# Patient Record
Sex: Female | Born: 1970 | Hispanic: Yes | Marital: Married | Smoking: Never smoker
Health system: Southern US, Community
[De-identification: ages and names within clinical notes are randomized; demographics above are authoritative.]

---

## 2011-01-16 ENCOUNTER — Inpatient Hospital Stay: Payer: Self-pay | Admitting: Surgery

## 2011-01-20 LAB — PATHOLOGY REPORT

## 2014-05-14 NOTE — Discharge Summary (Signed)
PATIENT NAME:  Phyllis StandsSOLANA Coleman, Phyllis Coleman MR#:  [id number] DATE OF BIRTH:  [birth year]  DATE OF ADMISSION:  01/16/2011 DATE OF DISCHARGE:  01/18/2011  HOSPITAL COURSE: Phyllis Coleman is a 44 year old woman admitted through the Emergency Room with signs and symptoms consistent with possible acute appendicitis. She had been sick for several days prior to admission and admitted through the Emergency Room in the early morning of 01/16/2011. She was taken to the operating room that morning where she underwent a laparoscopic appendectomy. The procedure was uncomplicated. She did appear to have significant inflammatory change around her appendix. She improved the following day and was able to tolerate a regular diet. She was discharged home on the 29th to be followed in the office in 7 to 10 days' time. Bathing, activity, and driving instructions were given to the patient. She is to resume her home medications and take Vicodin for pain.   FINAL DISCHARGE DIAGNOSIS: Acute appendicitis.   SURGERY: Laparoscopic appendectomy.  ____________________________ Quentin Orealph L. Ely III, MD rle:drc D: 01/24/2011 20:31:37 ET T: 01/27/2011 10:54:12 ET JOB#: [id number]  cc: Quentin Orealph L. Ely III, MD, <Dictator> Quentin OreALPH L ELY MD ELECTRONICALLY SIGNED 01/27/2011 20:58

## 2016-01-21 HISTORY — PX: BREAST BIOPSY: SHX20

## 2016-10-22 ENCOUNTER — Ambulatory Visit
Admission: RE | Admit: 2016-10-22 | Discharge: 2016-10-22 | Disposition: A | Discharge status: Home / Self Care | Payer: Self-pay | Source: Ambulatory Visit | Attending: Oncology | Admitting: Oncology

## 2016-10-22 ENCOUNTER — Encounter: Payer: Self-pay | Admitting: *Deleted

## 2016-10-22 ENCOUNTER — Other Ambulatory Visit: Payer: Self-pay | Admitting: *Deleted

## 2016-10-22 ENCOUNTER — Ambulatory Visit: Payer: Self-pay | Attending: Oncology | Admitting: *Deleted

## 2016-10-22 VITALS — BP 113/75 | HR 75 | Temp 97.0°F | Ht 60.0 in | Wt 123.0 lb

## 2016-10-22 DIAGNOSIS — N63 Unspecified lump in unspecified breast: Secondary | ICD-10-CM

## 2016-10-22 NOTE — Patient Instructions (Signed)
Gave patient hand-out, Women Staying Healthy, Active and Well from [facility], with education on breast health, pap smears, heart and colon health. 

## 2016-10-22 NOTE — Progress Notes (Signed)
Subjective:     Patient ID: Phyllis Coleman, female   DOB: 10-21-1970, 46 y.o.   MRN: [id number]  HPI Complains of left breast mass and itching.   Review of Systems     Objective:   Physical Exam  Pulmonary/Chest: Right breast exhibits no inverted nipple, no mass, no nipple discharge, no skin change and no tenderness. Left breast exhibits mass. Left breast exhibits no inverted nipple, no nipple discharge, no skin change and no tenderness. Breasts are symmetrical.         Assessment:     46 year old Hispanic female referred to [facility] by Ethelda Chick, MD from [facility] for further evaluation of a left breast mass and itching.  [name], the interpreter present during the interview and exam.  Patient states she found the lump after noticing some itching about 1 month ago.  States she saw her doctor and was prescribed an antibiotic for her sinus infection that the doctor thought might help her mass.  States the itching went away, but the mass did not change.  On clinical breast exam I can palpate an approximate 4 mm nodule at 6:00 left breast at the edge of the areola.  No nipple discharge, skin changes or lymphadenopathy noted.  Taught self breast awareness.  Last pap on 09/14/14 was negative per office notes.  Since there is no pap result for review, patient was informed her next pap will probably be due in one year.  Patient has been screened for eligibility.  She does not have any insurance, Medicare or Medicaid.  She also meets financial eligibility.  Hand-out given on the Affordable Care Act.    Plan:     Will get bilateral diagnostic mammogram and ultrasound.  Will follow-up per [facility] protocol.

## 2016-10-29 ENCOUNTER — Ambulatory Visit
Admission: RE | Admit: 2016-10-29 | Discharge: 2016-10-29 | Disposition: A | Discharge status: Home / Self Care | Payer: Self-pay | Source: Ambulatory Visit | Attending: Oncology | Admitting: Oncology

## 2016-10-29 DIAGNOSIS — N63 Unspecified lump in unspecified breast: Secondary | ICD-10-CM

## 2016-10-30 LAB — SURGICAL PATHOLOGY

## 2016-11-06 ENCOUNTER — Encounter: Payer: Self-pay | Admitting: *Deleted

## 2016-11-06 NOTE — Progress Notes (Unsigned)
Patient was notified of her benign biopsy by radiology.  She is to return to screening mammogram in one year.  [facility] to [place].

## 2017-12-02 ENCOUNTER — Ambulatory Visit
Admission: RE | Admit: 2017-12-02 | Discharge: 2017-12-02 | Disposition: A | Discharge status: Home / Self Care | Payer: Self-pay | Source: Ambulatory Visit | Attending: Oncology | Admitting: Oncology

## 2017-12-02 ENCOUNTER — Encounter (INDEPENDENT_AMBULATORY_CARE_PROVIDER_SITE_OTHER): Payer: Self-pay

## 2017-12-02 ENCOUNTER — Ambulatory Visit: Payer: Self-pay | Attending: Oncology

## 2017-12-02 VITALS — BP 109/57 | HR 70 | Temp 98.2°F | Ht 61.0 in | Wt 125.0 lb

## 2017-12-02 DIAGNOSIS — Z0000 Encounter for general adult medical examination without abnormal findings: Secondary | ICD-10-CM

## 2017-12-02 NOTE — Progress Notes (Addendum)
  Subjective:     Patient ID: Phyllis Coleman, female   DOB: 10-07-1970, 47 y.o.   MRN: [id number]  HPI   Review of Systems     Objective:   Physical Exam  Pulmonary/Chest: Right breast exhibits no inverted nipple, no mass, no nipple discharge, no skin change and no tenderness. Left breast exhibits no inverted nipple, no mass, no nipple discharge, no skin change and no tenderness. Breasts are symmetrical.  Genitourinary: No labial fusion. There is no rash, tenderness, lesion or injury on the right labia. There is no rash, tenderness, lesion or injury on the left labia. Uterus is deviated. Uterus is not enlarged, not fixed and not tender. Cervix exhibits no motion tenderness, no discharge and no friability. Right adnexum displays no mass, no tenderness and no fullness. Left adnexum displays no mass, no tenderness and no fullness. No erythema, tenderness or bleeding in the vagina. No foreign body in the vagina. No signs of injury around the vagina. No vaginal discharge found.  Genitourinary Comments: Uterus retroflexed       Assessment:     47 year old hispanic patient returns for annual [facility] screening.  Phyllis Coleman interpreted exam.  Patient screened, and meets [facility] eligibility.  Patient does not have insurance, Medicare or Medicaid.  Handout given on Affordable Care Act. Instructed patient on breast self awareness using teach back method.  Clinical breast exam unremarkable.  No mass or lump palpated.  Pelvic exam normal.    Risk Assessment    Risk Scores      12/02/2017   Last edited by: Jim LikeLambert, Sheena M, RN   5-year risk: 0.9 %   Lifetime risk: 8.4 %             Plan:     Sent for bilateral screening mammogram.  Specimen collected for pap.

## 2017-12-05 LAB — PAP LB AND HPV HIGH-RISK: HPV, HIGH-RISK: NEGATIVE

## 2017-12-20 NOTE — Progress Notes (Signed)
Letter mailed to patient to notify of normal mammogram, and pap smear results.Patient to return in one year for annual screening.  Copy to [facility]. 

## 2019-06-16 IMAGING — MG US BREAST BX W LOC DEV 1ST LESION IMG BX SPEC US GUIDE*L*
1 series · 8 of 8 positions shown · non-contrast
Comparison: Previous exam(s).

CLINICAL DATA: Left breast mass.

EXAM:
ULTRASOUND GUIDED LEFT BREAST CORE NEEDLE BIOPSY

[Series 1: MG view · 0.04mm/px · 8 of 14 slices shown]
[im 1/14]
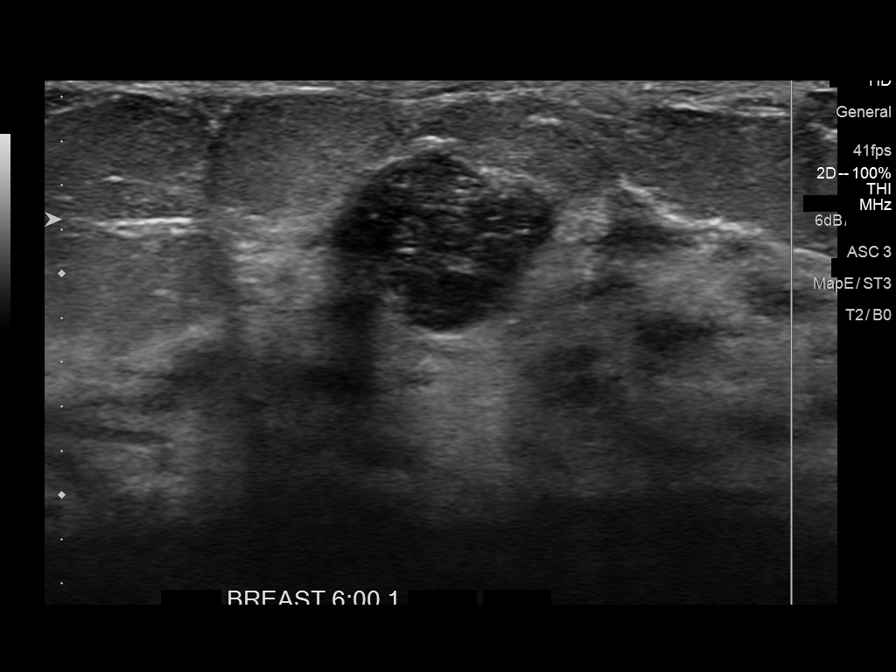
[im 2/14]
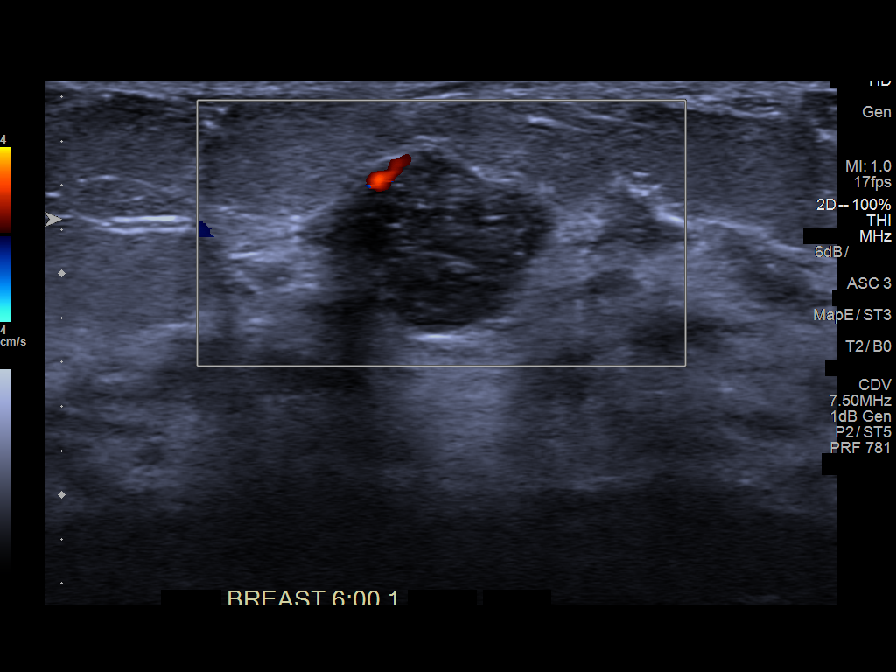
[im 4/14]
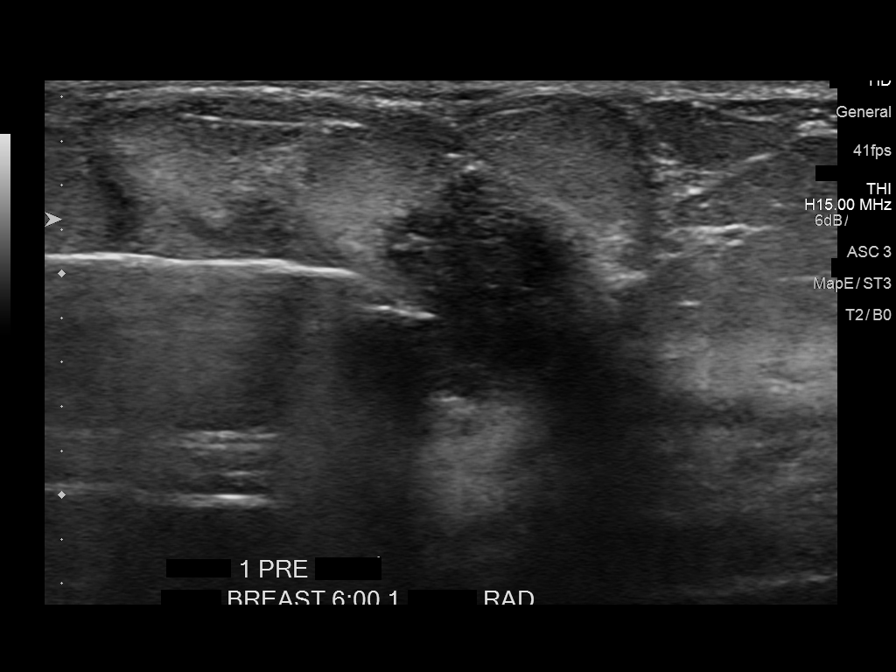
[im 6/14]
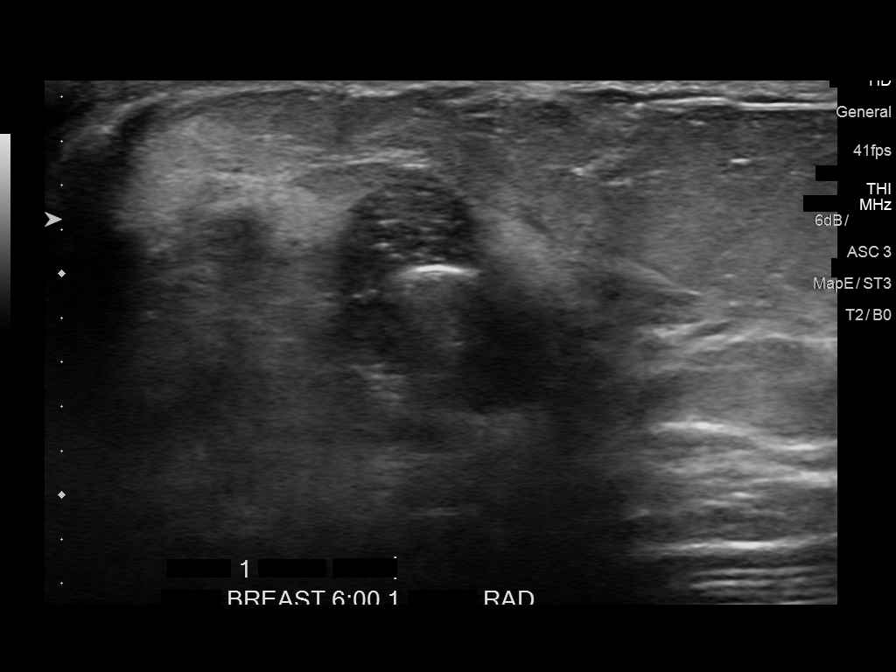
[im 8/14]
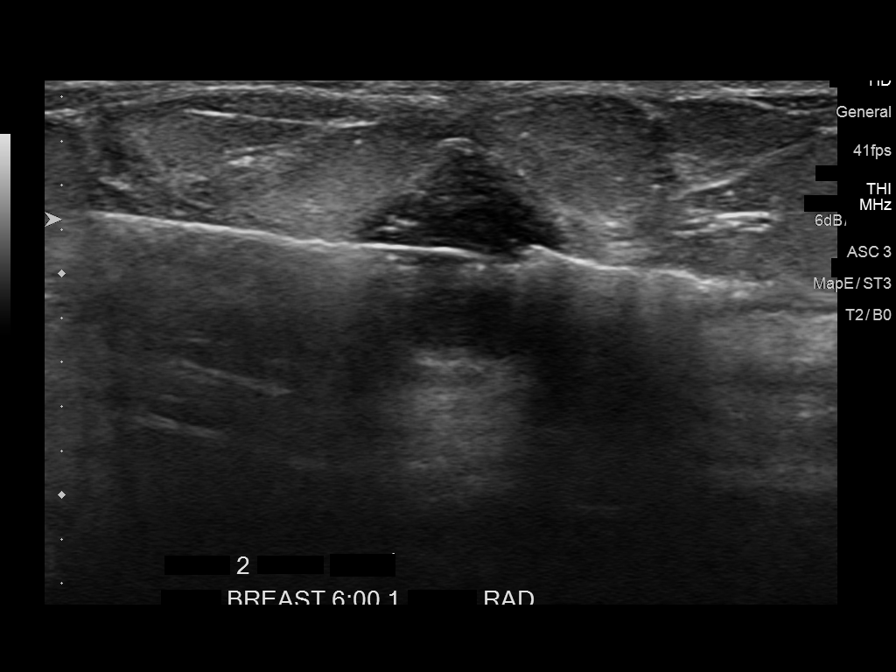
[im 10/14]
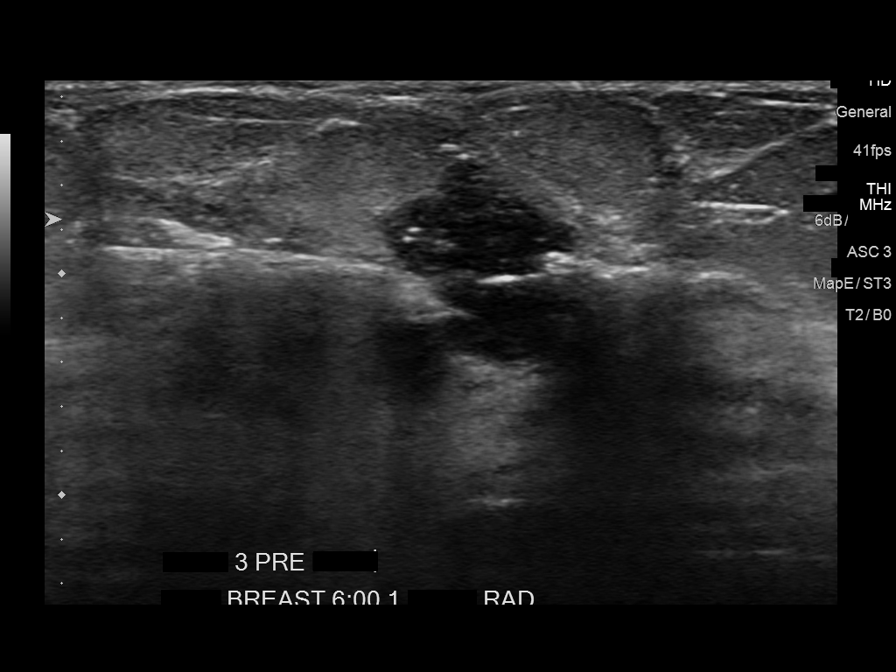
[im 12/14]
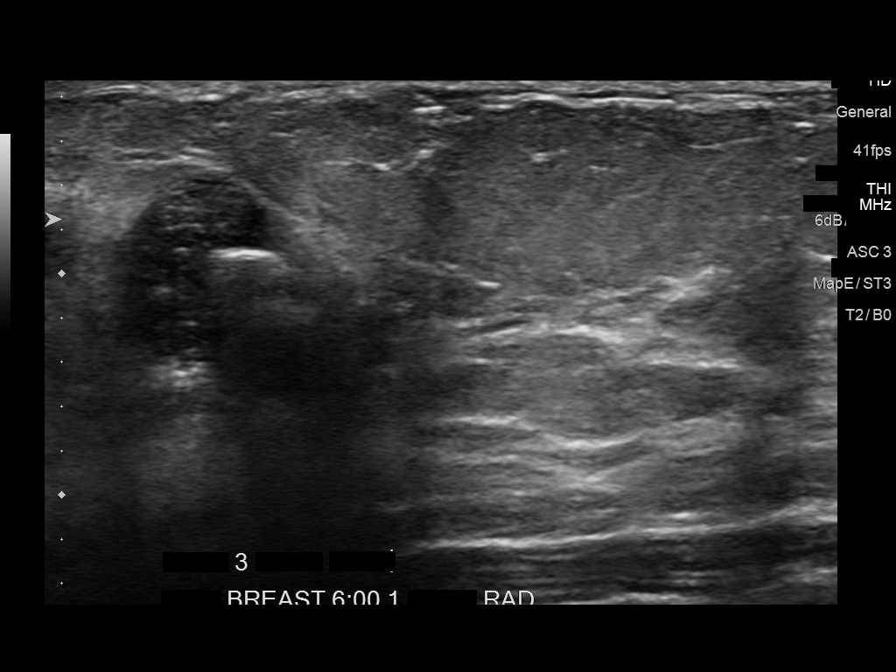
[im 14/14]
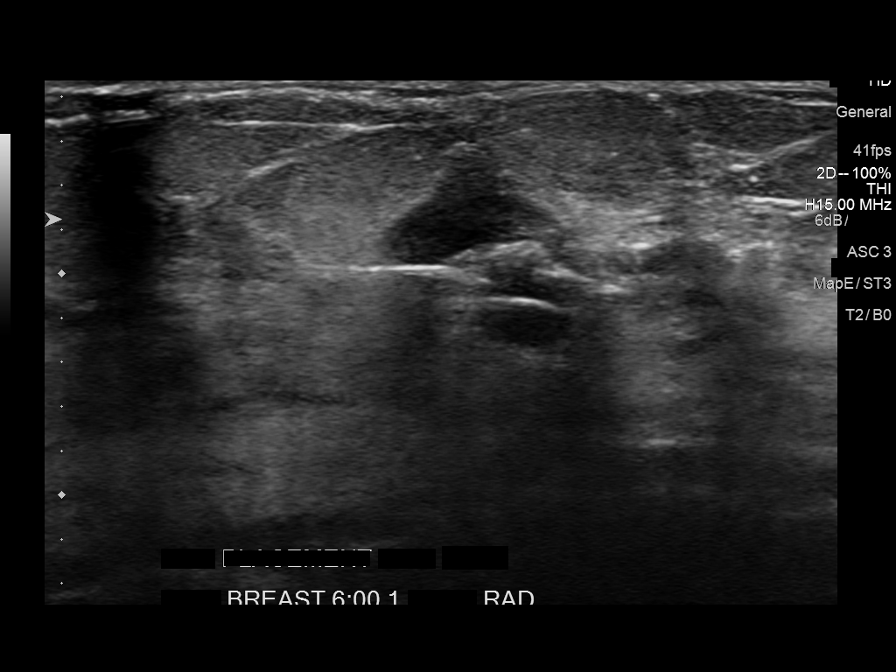

[8 of 8 positions shown; findings below may reference images not displayed]



Lesion quadrant: Lower-inner quadrant.

Using sterile technique and 1% Lidocaine as local anesthetic, under
direct ultrasound visualization, a 12 gauge Aldinever device was
used to perform biopsy of a mass in the lower slightly inner
quadrant of the left breast using a lateral to medial approach. At
the conclusion of the procedure a coil shaped tissue marker clip was
deployed into the biopsy cavity. Follow up 2 view mammogram was
performed and dictated separately.
IMPRESSION: Ultrasound guided biopsy of the left breast. No apparent
complications.

## 2020-10-22 ENCOUNTER — Encounter: Payer: Self-pay | Admitting: Gastroenterology

## 2020-10-22 ENCOUNTER — Ambulatory Visit: Payer: Self-pay | Admitting: Gastroenterology

## 2020-10-22 ENCOUNTER — Other Ambulatory Visit: Payer: Self-pay

## 2020-10-22 VITALS — BP 124/81 | HR 79 | Temp 98.1°F | Ht 66.0 in | Wt 132.8 lb

## 2020-10-22 DIAGNOSIS — D649 Anemia, unspecified: Secondary | ICD-10-CM

## 2020-10-22 DIAGNOSIS — R195 Other fecal abnormalities: Secondary | ICD-10-CM

## 2020-10-22 NOTE — Progress Notes (Signed)
Gave colon instructions in spanish from printed sheets in office

## 2020-10-22 NOTE — Progress Notes (Signed)
Arlyss Repress, MD [address]  [address]  [place], [place] [address]  Main: [phone]  Fax: [phone]    Gastroenterology Consultation  Referring Provider:     Kathrynn Running, MD Primary Care Physician:  Ethelda Chick, MD Primary Gastroenterologist:  Dr. Arlyss Repress Reason for Consultation:     Anemia, occult blood positive        HPI:   Phyllis Coleman is a 50 y.o. female referred by Dr. Ethelda Chick, MD  for consultation & management of anemia and occult blood positive.  Patient reports that she was evaluated about 2 to 3 months ago for abdominal bloating and anemia.  She was told that she is anemic.  She was also told that she may have ulcers in her stomach.  She also had stool card test which detected blood.  Therefore, she is referred to GI for endoscopic evaluation.  Patient denies any abdominal pain, nausea or vomiting.  She denies any gross blood in stool, melena.  She reports that she has known history of anemia from heavy menstrual cycles.  She does not smoke or drink alcohol I do not have a copy of lab results with me today  Patient had 3 C-sections  She denies family history of GI malignancy  NSAIDs: Ibuprofen  Antiplts/Anticoagulants/Anti thrombotics: None  GI Procedures: None  History reviewed. No pertinent past medical history.  Past Surgical History:  Procedure Laterality Date   BREAST BIOPSY Left 2018   benign    Current Outpatient Medications:    butalbital-acetaminophen-caffeine (FIORICET) 50-325-40 MG tablet, Take 1 tablet by mouth 2 (two) times daily as needed., Disp: , Rfl:    fluticasone (FLONASE) 50 MCG/ACT nasal spray, 1 spray by Each Nare route daily., Disp: , Rfl:    ibuprofen (ADVIL) 800 MG tablet, Take 800 mg by mouth 3 (three) times daily., Disp: , Rfl:    montelukast (SINGULAIR) 10 MG tablet, Take by mouth., Disp: , Rfl:    omeprazole (PRILOSEC) 20 MG capsule, Take 20 mg by mouth 2 (two) times  daily., Disp: , Rfl:    SUMAtriptan (IMITREX) 100 MG tablet, Take 100 mg by mouth as directed., Disp: , Rfl:     Family History  Problem Relation Age of Onset   Breast cancer Neg Hx      Social History   Tobacco Use   Smoking status: Never   Smokeless tobacco: Never  Substance Use Topics   Alcohol use: Not Currently    Allergies as of 10/22/2020   (No Known Allergies)    Review of Systems:    All systems reviewed and negative except where noted in HPI.   Physical Exam:  BP 124/81 (BP Location: Left Arm, Patient Position: Sitting, Cuff Size: Normal)   Pulse 79   Temp 98.1 F (36.7 C) (Oral)   Ht 5\' 6"  (1.676 m)   Wt 60.2 kg   BMI 21.43 kg/m  No LMP recorded.  General:   Alert,  Well-developed, well-nourished, pleasant and cooperative in NAD Head:  Normocephalic and atraumatic. Eyes:  Sclera clear, no icterus.   Conjunctiva pink. Ears:  Normal auditory acuity. Nose:  No deformity, discharge, or lesions. Mouth:  No deformity or lesions,oropharynx pink & moist. Neck:  Supple; no masses or thyromegaly. Lungs:  Respirations even and unlabored.  Clear throughout to auscultation.   No wheezes, crackles, or rhonchi. No acute distress. Heart:  Regular rate and rhythm; no murmurs, clicks, rubs, or gallops. Abdomen:  Normal bowel sounds. Soft, non-tender and non-distended without masses, hepatosplenomegaly or hernias noted.  No guarding or rebound tenderness.   Rectal: Not performed Msk:  Symmetrical without gross deformities. Good, equal movement & strength bilaterally. Pulses:  Normal pulses noted. Extremities:  No clubbing or edema.  No cyanosis. Neurologic:  Alert and oriented x3;  grossly normal neurologically. Skin:  Intact without significant lesions or rashes. No jaundice. Psych:  Alert and cooperative. Normal mood and affect.  Imaging Studies: None  Assessment and Plan:   Phyllis Coleman is a 50 y.o. pleasant Spanish-speaking female with history of  anemia, occult blood positive stool, history of NSAID use, upper abdominal bloating  Recommend upper endoscopy as well as colonoscopy Recheck CBC, iron panel, B12 and folate levels Avoid NSAID use Continue omeprazole 20 mg twice daily   Follow up after above work-up   Arlyss Repress, MD

## 2020-10-22 NOTE — Patient Instructions (Signed)
Gave prep sample clenpig for preocedure

## 2020-10-23 ENCOUNTER — Telehealth: Payer: Self-pay

## 2020-10-23 LAB — B12 AND FOLATE PANEL
Folate: 19.2 ng/mL (ref >3.0)
Vitamin B-12: >2000 pg/mL — ABNORMAL HIGH (ref 232–1245)

## 2020-10-23 LAB — IRON,TIBC AND FERRITIN PANEL
Ferritin: 7 ng/mL — ABNORMAL LOW (ref 15–150)
Iron Saturation: 4 % — CL (ref 15–55)
Iron: 19 ug/dL — ABNORMAL LOW (ref 27–159)
Total Iron Binding Capacity: 428 ug/dL (ref 250–450)
UIBC: 409 ug/dL (ref 131–425)

## 2020-10-23 LAB — CBC
Hematocrit: 33.6 % — ABNORMAL LOW (ref 34.0–46.6)
Hemoglobin: 10.7 g/dL — ABNORMAL LOW (ref 11.1–15.9)
MCH: 24.5 pg — ABNORMAL LOW (ref 26.6–33.0)
MCHC: 31.8 g/dL (ref 31.5–35.7)
MCV: 77 fL — ABNORMAL LOW (ref 79–97)
Platelets: 338 10*3/uL (ref 150–450)
RBC: 4.36 x10E6/uL (ref 3.77–5.28)
RDW: 16.7 % — ABNORMAL HIGH (ref 11.7–15.4)
WBC: 5.3 10*3/uL (ref 3.4–10.8)

## 2020-10-23 MED ORDER — FUSION PLUS PO CAPS: 1.0000 | ORAL_CAPSULE | Freq: Every day | ORAL | 2 refills | Status: AC

## 2020-10-23 NOTE — Telephone Encounter (Signed)
-----   Message from Rohini Reddy Vanga, MD sent at 10/23/2020 11:14 AM EDT ----- Please inform patient that she has severe iron deficiency anemia, recommend fusion plus 1 pill daily or every other day for 3 months.  Also, please tell patient to avoid use of Advil, Aleve, Motrin, high-dose aspirin, BCs, Goody's etc.  RV 

## 2020-10-23 NOTE — Telephone Encounter (Signed)
-----   Message from Toney Reil, MD sent at 10/23/2020 11:14 AM EDT ----- Please inform patient that she has severe iron deficiency anemia, recommend fusion plus 1 pill daily or every other day for 3 months.  Also, please tell patient to avoid use of Advil, Aleve, Motrin, high-dose aspirin, BCs, Goody's etc.  RV

## 2020-10-23 NOTE — Telephone Encounter (Signed)
CALLED PATIENT ABOUT RESULTS NO ANSWER [id number] LEFT VOICEMAIL FOR A CALL BACK ALREADY SENT FUSION PLUS TO PHARMACY FOR PATIENT

## 2020-10-23 NOTE — Telephone Encounter (Signed)
CALLED PATIENT NO ANSWER WAS CALLING HER ABOUT RESULTS

## 2020-10-24 ENCOUNTER — Telehealth: Payer: Self-pay

## 2020-10-24 NOTE — Telephone Encounter (Signed)
-----   Message from Rohini Reddy Vanga, MD sent at 10/23/2020 11:14 AM EDT ----- Please inform patient that she has severe iron deficiency anemia, recommend fusion plus 1 pill daily or every other day for 3 months.  Also, please tell patient to avoid use of Advil, Aleve, Motrin, high-dose aspirin, BCs, Goody's etc.  RV 

## 2020-10-24 NOTE — Telephone Encounter (Signed)
FINISHED

## 2020-10-24 NOTE — Telephone Encounter (Signed)
Interp# [id number] called patient she understands her results and understands about the medicine patient had no questions

## 2020-11-07 ENCOUNTER — Ambulatory Visit
Admission: RE | Admit: 2020-11-07 | Discharge: 2020-11-07 | Disposition: A | Discharge status: Home / Self Care | Payer: Self-pay | Attending: Gastroenterology | Admitting: Gastroenterology

## 2020-11-07 ENCOUNTER — Encounter
Admission: RE | Disposition: A | Discharge status: Home / Self Care | Payer: Self-pay | Source: Home / Self Care | Attending: Gastroenterology

## 2020-11-07 ENCOUNTER — Ambulatory Visit: Payer: Self-pay | Admitting: Certified Registered"

## 2020-11-07 ENCOUNTER — Telehealth: Payer: Self-pay

## 2020-11-07 ENCOUNTER — Other Ambulatory Visit: Payer: Self-pay

## 2020-11-07 DIAGNOSIS — Z79899 Other long term (current) drug therapy: Secondary | ICD-10-CM | POA: Insufficient documentation

## 2020-11-07 DIAGNOSIS — R195 Other fecal abnormalities: Secondary | ICD-10-CM

## 2020-11-07 DIAGNOSIS — D649 Anemia, unspecified: Secondary | ICD-10-CM

## 2020-11-07 DIAGNOSIS — K2950 Unspecified chronic gastritis without bleeding: Secondary | ICD-10-CM | POA: Insufficient documentation

## 2020-11-07 DIAGNOSIS — D509 Iron deficiency anemia, unspecified: Secondary | ICD-10-CM

## 2020-11-07 DIAGNOSIS — K621 Rectal polyp: Secondary | ICD-10-CM | POA: Insufficient documentation

## 2020-11-07 DIAGNOSIS — B9681 Helicobacter pylori [H. pylori] as the cause of diseases classified elsewhere: Secondary | ICD-10-CM | POA: Insufficient documentation

## 2020-11-07 HISTORY — PX: ESOPHAGOGASTRODUODENOSCOPY: SHX5428

## 2020-11-07 HISTORY — PX: COLONOSCOPY WITH PROPOFOL: SHX5780

## 2020-11-07 LAB — POCT PREGNANCY, URINE: Preg Test, Ur: NEGATIVE

## 2020-11-07 SURGERY — COLONOSCOPY WITH PROPOFOL
Anesthesia: General

## 2020-11-07 MED ORDER — LIDOCAINE HCL (CARDIAC) PF 100 MG/5ML IV SOSY
PREFILLED_SYRINGE | INTRAVENOUS | Status: DC | PRN
  Administered 2020-11-07: 100 mg via INTRAVENOUS

## 2020-11-07 MED ORDER — PROPOFOL 500 MG/50ML IV EMUL
INTRAVENOUS | Status: DC | PRN
  Administered 2020-11-07: 75 ug/kg/min via INTRAVENOUS
  Administered 2020-11-07: 160 mg via INTRAVENOUS
  Administered 2020-11-07: 100 mg via INTRAVENOUS

## 2020-11-07 MED ORDER — EPHEDRINE SULFATE 50 MG/ML IJ SOLN
INTRAMUSCULAR | Status: DC | PRN
  Administered 2020-11-07: 5 mg via INTRAVENOUS
  Administered 2020-11-07: 10 mg via INTRAVENOUS

## 2020-11-07 MED ORDER — DEXMEDETOMIDINE HCL IN NACL 400 MCG/100ML IV SOLN
INTRAVENOUS | Status: DC | PRN
  Administered 2020-11-07: 8 ug via INTRAVENOUS

## 2020-11-07 MED ORDER — SODIUM CHLORIDE 0.9 % IV SOLN
INTRAVENOUS | Status: DC
  Administered 2020-11-07: 20 mL/h via INTRAVENOUS

## 2020-11-07 MED ORDER — GLYCOPYRROLATE 0.2 MG/ML IJ SOLN
INTRAMUSCULAR | Status: DC | PRN
  Administered 2020-11-07: .2 mg via INTRAVENOUS

## 2020-11-07 NOTE — Op Note (Signed)
[facility] Gastroenterology Patient Name: Phyllis Coleman Procedure Date: 11/07/2020 8:20 AM MRN: [id number] Account #: [id number] Date of Birth: [birth year] Admit Type: Outpatient Age: 50 Room: [facility] ENDO ROOM 4 Gender: Female Note Status: Finalized Instrument Name: Upper Endoscope [id number] Procedure:             Upper GI endoscopy Indications:           Iron deficiency anemia Providers:             Toney Reil MD, MD Referring MD:          Ethelda Chick (Referring MD) Medicines:             General Anesthesia Complications:         No immediate complications. Estimated blood loss: None. Procedure:             Pre-Anesthesia Assessment:                        - Prior to the procedure, a History and Physical was                         performed, and patient medications and allergies were                         reviewed. The patient is competent. The risks and                         benefits of the procedure and the sedation options and                         risks were discussed with the patient. All questions                         were answered and informed consent was obtained.                         Patient identification and proposed procedure were                         verified by the physician, the nurse, the                         anesthesiologist, the anesthetist and the technician                         in the pre-procedure area in the procedure room in the                         endoscopy suite. Mental Status Examination: alert and                         oriented. Airway Examination: normal oropharyngeal                         airway and neck mobility. Respiratory Examination:                         clear to auscultation. CV Examination: normal.  Prophylactic Antibiotics: The patient does not require                         prophylactic antibiotics. Prior Anticoagulants: The                          patient has taken no previous anticoagulant or                         antiplatelet agents. ASA Grade Assessment: II - A                         patient with mild systemic disease. After reviewing                         the risks and benefits, the patient was deemed in                         satisfactory condition to undergo the procedure. The                         anesthesia plan was to use general anesthesia.                         Immediately prior to administration of medications,                         the patient was re-assessed for adequacy to receive                         sedatives. The heart rate, respiratory rate, oxygen                         saturations, blood pressure, adequacy of pulmonary                         ventilation, and response to care were monitored                         throughout the procedure. The physical status of the                         patient was re-assessed after the procedure.                        After obtaining informed consent, the endoscope was                         passed under direct vision. Throughout the procedure,                         the patient's blood pressure, pulse, and oxygen                         saturations were monitored continuously. The Endoscope                         was introduced through the mouth, and advanced to the  second part of duodenum. The upper GI endoscopy was                         accomplished without difficulty. The patient tolerated                         the procedure well. Findings:      The duodenal bulb and second portion of the duodenum were normal.       Biopsies for histology were taken with a cold forceps for evaluation of       celiac disease.      The entire examined stomach was normal. Biopsies were taken with a cold       forceps for Helicobacter pylori testing.      The gastroesophageal junction and examined esophagus were normal. Impression:             - Normal duodenal bulb and second portion of the                         duodenum. Biopsied.                        - Normal stomach. Biopsied.                        - Normal gastroesophageal junction and esophagus. Recommendation:        - Await pathology results.                        - Proceed with colonoscopy as scheduled                        See colonoscopy report Procedure Code(s):     --- Professional ---                        [phone], Esophagogastroduodenoscopy, flexible,                         transoral; with biopsy, single or multiple Diagnosis Code(s):     --- Professional ---                        D50.9, Iron deficiency anemia, unspecified CPT copyright 2019 American Medical Association. All rights reserved. The codes documented in this report are preliminary and upon coder review may  be revised to meet current compliance requirements. Dr. Libby Maw Toney Reil MD, MD 11/07/2020 8:38:38 AM This report has been signed electronically. Number of Addenda: 0 Note Initiated On: 11/07/2020 8:20 AM Estimated Blood Loss:  Estimated blood loss: none.      [facility]

## 2020-11-07 NOTE — Telephone Encounter (Signed)
Called patient to let her know samples have been put upfront for fusion plus

## 2020-11-07 NOTE — Op Note (Signed)
[facility] Gastroenterology Patient Name: Phyllis Coleman Procedure Date: 11/07/2020 8:19 AM MRN: [id number] Account #: [id number] Date of Birth: [birth year] Admit Type: Outpatient Age: 50 Room: [facility] ENDO ROOM 4 Gender: Female Note Status: Finalized Instrument Name: Peds Colonoscope [id number] Procedure:             Colonoscopy Indications:           This is the patient's first colonoscopy,                         Gastrointestinal occult blood loss, Unexplained iron                         deficiency anemia, Positive fecal immunochemical test Providers:             Toney Reil MD, MD Referring MD:          Ethelda Chick (Referring MD) Medicines:             General Anesthesia Complications:         No immediate complications. Estimated blood loss: None. Procedure:             Pre-Anesthesia Assessment:                        - Prior to the procedure, a History and Physical was                         performed, and patient medications and allergies were                         reviewed. The patient is competent. The risks and                         benefits of the procedure and the sedation options and                         risks were discussed with the patient. All questions                         were answered and informed consent was obtained.                         Patient identification and proposed procedure were                         verified by the physician, the nurse, the                         anesthesiologist, the anesthetist and the technician                         in the pre-procedure area in the procedure room in the                         endoscopy suite. Mental Status Examination: alert and                         oriented. Airway Examination: normal oropharyngeal  airway and neck mobility. Respiratory Examination:                         clear to auscultation. CV Examination: normal.                          Prophylactic Antibiotics: The patient does not require                         prophylactic antibiotics. Prior Anticoagulants: The                         patient has taken no previous anticoagulant or                         antiplatelet agents. ASA Grade Assessment: II - A                         patient with mild systemic disease. After reviewing                         the risks and benefits, the patient was deemed in                         satisfactory condition to undergo the procedure. The                         anesthesia plan was to use general anesthesia.                         Immediately prior to administration of medications,                         the patient was re-assessed for adequacy to receive                         sedatives. The heart rate, respiratory rate, oxygen                         saturations, blood pressure, adequacy of pulmonary                         ventilation, and response to care were monitored                         throughout the procedure. The physical status of the                         patient was re-assessed after the procedure.                        After obtaining informed consent, the colonoscope was                         passed under direct vision. Throughout the procedure,                         the patient's blood pressure, pulse, and oxygen  saturations were monitored continuously. The                         Colonoscope was introduced through the anus and                         advanced to the the cecum, identified by appendiceal                         orifice and ileocecal valve. The colonoscopy was                         performed without difficulty. The patient tolerated                         the procedure well. The quality of the bowel                         preparation was evaluated using the BBPS [facility] Bowel                         Preparation Scale) with scores of: Right Colon = 3,                          Transverse Colon = 3 and Left Colon = 3 (entire mucosa                         seen well with no residual staining, small fragments                         of stool or opaque liquid). The total BBPS score                         equals 9. Findings:      The perianal and digital rectal examinations were normal. Pertinent       negatives include normal sphincter tone and no palpable rectal lesions.      A 12 mm polyp was found in the distal rectum. The polyp was flat.       Preparations were made for mucosal resection. Chromoscopy with methylene       blue was done to mark the borders of the lesion. Eleview was injected       with adequate lift of the lesion from the muscularis propria. Snare       mucosal resection with suction (via the working channel) retrieval was       performed. A 12 mm area was resected. Resection and retrieval were       complete. There was no bleeding during and at the end of the procedure.      The retroflexed view of the distal rectum and anal verge was normal and       showed no anal or rectal abnormalities. Impression:            - One 12 mm polyp in the distal rectum, removed with                         mucosal resection. Resected and retrieved.                        -  The distal rectum and anal verge are normal on                         retroflexion view.                        - Mucosal resection was performed. Resection and                         retrieval were complete. Recommendation:        - Discharge patient to home (with escort).                        - Resume previous diet today.                        - Continue present medications.                        - Await pathology results.                        - Repeat colonoscopy in 3 - 5 years for surveillance                         based on pathology results.                        - Return to my office as previously scheduled. Procedure Code(s):     --- Professional  ---                        [phone], Colonoscopy, flexible; with endoscopic mucosal                         resection Diagnosis Code(s):     --- Professional ---                        K62.1, Rectal polyp                        R19.5, Other fecal abnormalities                        D50.9, Iron deficiency anemia, unspecified CPT copyright 2019 American Medical Association. All rights reserved. The codes documented in this report are preliminary and upon coder review may  be revised to meet current compliance requirements. Dr. Libby Maw Toney Reil MD, MD 11/07/2020 9:01:51 AM This report has been signed electronically. Number of Addenda: 0 Note Initiated On: 11/07/2020 8:19 AM Scope Withdrawal Time: 0 hours 15 minutes 14 seconds  Total Procedure Duration: 0 hours 18 minutes 34 seconds  Estimated Blood Loss:  Estimated blood loss: none.      [facility]

## 2020-11-07 NOTE — Anesthesia Postprocedure Evaluation (Signed)
Anesthesia Post Note  Patient: Phyllis Coleman  Procedure(s) Performed: COLONOSCOPY WITH PROPOFOL ESOPHAGOGASTRODUODENOSCOPY (EGD)  Patient location during evaluation: Phase II Anesthesia Type: General Level of consciousness: awake and alert, awake and oriented Pain management: pain level controlled Vital Signs Assessment: post-procedure vital signs reviewed and stable Respiratory status: spontaneous breathing, nonlabored ventilation and respiratory function stable Cardiovascular status: blood pressure returned to baseline and stable Postop Assessment: no apparent nausea or vomiting Anesthetic complications: no   No notable events documented.   Last Vitals:  Vitals:   11/07/20 0922 11/07/20 0931  BP: 108/62 (!) 109/59  Pulse: 78 68  Resp: 15 11  Temp:    SpO2: 100% 100%    Last Pain:  Vitals:   11/07/20 0931  TempSrc:   PainSc: 0-No pain                 Manfred Arch

## 2020-11-07 NOTE — H&P (Signed)
Arlyss Repress, MD [address]  [address]  [place], [place] [address]  Main: [phone]  Fax: [phone] Pager: [phone]  Primary Care Physician:  Ethelda Chick, MD Primary Gastroenterologist:  Dr. Arlyss Repress  Pre-Procedure History & Physical: HPI:  Phyllis Coleman is a 50 y.o. female is here for an endoscopy and colonoscopy.   No past medical history on file.  Past Surgical History:  Procedure Laterality Date   BREAST BIOPSY Left 2018   benign    Prior to Admission medications   Medication Sig Start Date End Date Taking? Authorizing Provider  butalbital-acetaminophen-caffeine (FIORICET) 50-325-40 MG tablet Take 1 tablet by mouth 2 (two) times daily as needed. 09/06/20  Yes [provider]  fluticasone (FLONASE) 50 MCG/ACT nasal spray 1 spray by Each Nare route daily.   Yes [provider]  ibuprofen (ADVIL) 800 MG tablet Take 800 mg by mouth 3 (three) times daily. 10/08/20  Yes [provider]  Iron-FA-B Cmp-C-Biot-Probiotic (FUSION PLUS) CAPS Take 1 capsule by mouth daily. 10/23/20  Yes Prachi Oftedahl, Loel Dubonnet, MD  montelukast (SINGULAIR) 10 MG tablet Take by mouth.   Yes [provider]  omeprazole (PRILOSEC) 20 MG capsule Take 20 mg by mouth 2 (two) times daily. 08/03/20  Yes [provider]  SUMAtriptan (IMITREX) 100 MG tablet Take 100 mg by mouth as directed. 10/08/20  Yes [provider]    Allergies as of 10/22/2020   (No Known Allergies)    Family History  Problem Relation Age of Onset   Breast cancer Neg Hx     Social History   Socioeconomic History   Marital status: Married    Spouse name: Not on file   Number of children: Not on file   Years of education: Not on file   Highest education level: Not on file  Occupational History   Not on file  Tobacco Use   Smoking status: Never   Smokeless tobacco: Never  Substance and Sexual Activity   Alcohol use: Not Currently   Drug  use: Not on file   Sexual activity: Not on file  Other Topics Concern   Not on file  Social History Narrative   Not on file   Social Determinants of Health   Financial Resource Strain: Not on file  Food Insecurity: Not on file  Transportation Needs: Not on file  Physical Activity: Not on file  Stress: Not on file  Social Connections: Not on file  Intimate Partner Violence: Not on file    Review of Systems: See HPI, otherwise negative ROS  Physical Exam: BP 108/70   Pulse 81   Temp (!) 96.1 F (35.6 C) (Temporal)   Resp 20   Ht 5\' 1"  (1.549 m)   Wt 58.1 kg   SpO2 100%   BMI 24.19 kg/m  General:   Alert,  pleasant and cooperative in NAD Head:  Normocephalic and atraumatic. Neck:  Supple; no masses or thyromegaly. Lungs:  Clear throughout to auscultation.    Heart:  Regular rate and rhythm. Abdomen:  Soft, nontender and nondistended. Normal bowel sounds, without guarding, and without rebound.   Neurologic:  Alert and  oriented x4;  grossly normal neurologically.  Impression/Plan: is here for an endoscopy and colonoscopy to be performed for IDA, occult blood positive  Risks, benefits, limitations, and alternatives regarding  endoscopy and colonoscopy have been reviewed with the patient.  Questions have been answered.  All parties agreeable.  Lannette Donath, MD  11/07/2020, 8:16 AM

## 2020-11-07 NOTE — Anesthesia Preprocedure Evaluation (Signed)
Anesthesia Evaluation  Patient identified by MRN, date of birth, ID band Patient awake    Reviewed: Allergy & Precautions, NPO status , Patient's Chart, lab work & pertinent test results  Airway Mallampati: II  TM Distance: >3 FB Neck ROM: Full    Dental no notable dental hx.    Pulmonary neg pulmonary ROS,    Pulmonary exam normal        Cardiovascular negative cardio ROS Normal cardiovascular exam     Neuro/Psych negative neurological ROS  negative psych ROS   GI/Hepatic Neg liver ROS, GERD  Medicated,  Endo/Other  negative endocrine ROS  Renal/GU negative Renal ROS  negative genitourinary   Musculoskeletal negative musculoskeletal ROS (+)   Abdominal   Peds negative pediatric ROS (+)  Hematology negative hematology ROS (+)   Anesthesia Other Findings   Reproductive/Obstetrics negative OB ROS                             Anesthesia Physical Anesthesia Plan  ASA: 2  Anesthesia Plan: General   Post-op Pain Management:    Induction: Intravenous  PONV Risk Score and Plan: 2 and Propofol infusion and TIVA  Airway Management Planned: Natural Airway and Nasal Cannula  Additional Equipment:   Intra-op Plan:   Post-operative Plan:   Informed Consent: I have reviewed the patients History and Physical, chart, labs and discussed the procedure including the risks, benefits and alternatives for the proposed anesthesia with the patient or authorized representative who has indicated his/her understanding and acceptance.       Plan Discussed with: CRNA, Anesthesiologist and Surgeon  Anesthesia Plan Comments:         Anesthesia Quick Evaluation

## 2020-11-07 NOTE — Transfer of Care (Signed)
Immediate Anesthesia Transfer of Care Note  Patient: Phyllis Coleman  Procedure(s) Performed: COLONOSCOPY WITH PROPOFOL ESOPHAGOGASTRODUODENOSCOPY (EGD)  Patient Location: PACU  Anesthesia Type:General  Level of Consciousness: alert   Airway & Oxygen Therapy: Patient Spontanous Breathing  Post-op Assessment: Report given to RN  Post vital signs: stable  Last Vitals:  Vitals Value Taken Time  BP 96/53 11/07/20 0902  Temp 36.4 C 11/07/20 0902  Pulse 90 11/07/20 0904  Resp 20 11/07/20 0904  SpO2 100 % 11/07/20 0904  Vitals shown include unvalidated device data.  Last Pain:  Vitals:   11/07/20 0902  TempSrc: Temporal  PainSc: Asleep         Complications: No notable events documented.

## 2020-11-08 ENCOUNTER — Encounter: Payer: Self-pay | Admitting: Gastroenterology

## 2020-11-09 ENCOUNTER — Other Ambulatory Visit: Payer: Self-pay

## 2020-11-09 ENCOUNTER — Encounter: Payer: Self-pay | Admitting: Gastroenterology

## 2020-11-09 ENCOUNTER — Telehealth: Payer: Self-pay

## 2020-11-09 DIAGNOSIS — D508 Other iron deficiency anemias: Secondary | ICD-10-CM

## 2020-11-09 LAB — SURGICAL PATHOLOGY

## 2020-11-09 MED ORDER — CLARITHROMYCIN 500 MG PO TABS: 500.0000 mg | ORAL_TABLET | Freq: Two times a day (BID) | ORAL | 0 refills | Status: AC

## 2020-11-09 MED ORDER — AMOXICILLIN 500 MG PO TABS: 1000.0000 mg | ORAL_TABLET | Freq: Two times a day (BID) | ORAL | 0 refills | Status: AC

## 2020-11-09 MED ORDER — OMEPRAZOLE 20 MG PO CPDR: 20.0000 mg | DELAYED_RELEASE_CAPSULE | Freq: Two times a day (BID) | ORAL | 0 refills | Status: AC

## 2020-11-09 NOTE — Telephone Encounter (Signed)
-----   Message from Toney Reil, MD sent at 11/09/2020  9:50 AM EDT ----- Pathology results from upper endoscopy came back positive for Helicobacter pylori infection which explains her iron deficiency anemia Plz send her the prescription for triple therapy to treat H Pylori for 14days  Omeprazole 20mg  BID (patient is already on omeprazole, no need to send new prescription) Clarithromycin 500mg  BID Amoxicillin 1gm BID  Order H Pylori breath test in 4weeks after completing medication to confirm eradication. She should be off prilosec and H2 blocker atleast for 2weeks before the test  Thanks RV

## 2020-11-09 NOTE — Telephone Encounter (Signed)
Called patient with interp patient understood her results and will come in 4 weeks after she finishes her meds to take a hpylori breath test to make sure she is better

## 2021-01-08 LAB — H. PYLORI BREATH TEST: H pylori Breath Test: NEGATIVE

## 2021-01-09 ENCOUNTER — Telehealth: Payer: Self-pay

## 2021-01-09 NOTE — Telephone Encounter (Signed)
Called patient with interpreter patient understands and is very happy

## 2021-01-09 NOTE — Telephone Encounter (Signed)
-----   Message from Toney Reil, MD sent at 01/09/2021 10:07 AM EST ----- Please inform patient that her H. pylori breath test came back negative which means she no longer has H. pylori infection  RV
